# Patient Record
Sex: Male | Born: 1955 | Race: Black or African American | Hispanic: No | Marital: Married | Smoking: Never smoker
Health system: Southern US, Community
[De-identification: ages and names within clinical notes are randomized; demographics above are authoritative.]

---

## 1999-03-25 ENCOUNTER — Emergency Department (HOSPITAL_COMMUNITY): Admission: EM | Admit: 1999-03-25 | Discharge: 1999-03-25 | Payer: Self-pay | Admitting: Emergency Medicine

## 1999-03-25 ENCOUNTER — Encounter: Payer: Self-pay | Admitting: Emergency Medicine

## 2016-06-09 ENCOUNTER — Other Ambulatory Visit: Payer: Self-pay | Admitting: Occupational Medicine

## 2016-06-09 ENCOUNTER — Ambulatory Visit
Admission: RE | Admit: 2016-06-09 | Discharge: 2016-06-09 | Disposition: A | Discharge status: Home / Self Care | Payer: No Typology Code available for payment source | Source: Ambulatory Visit | Attending: Occupational Medicine | Admitting: Occupational Medicine

## 2016-06-09 DIAGNOSIS — Z021 Encounter for pre-employment examination: Secondary | ICD-10-CM

## 2016-06-15 DIAGNOSIS — K640 First degree hemorrhoids: Secondary | ICD-10-CM | POA: Diagnosis not present

## 2016-06-15 DIAGNOSIS — K5730 Diverticulosis of large intestine without perforation or abscess without bleeding: Secondary | ICD-10-CM | POA: Diagnosis not present

## 2016-06-15 DIAGNOSIS — Z86010 Personal history of colonic polyps: Secondary | ICD-10-CM | POA: Diagnosis not present

## 2016-06-27 DIAGNOSIS — R319 Hematuria, unspecified: Secondary | ICD-10-CM | POA: Diagnosis not present

## 2016-07-27 DIAGNOSIS — R3121 Asymptomatic microscopic hematuria: Secondary | ICD-10-CM | POA: Diagnosis not present

## 2016-08-05 DIAGNOSIS — R3121 Asymptomatic microscopic hematuria: Secondary | ICD-10-CM | POA: Diagnosis not present

## 2016-08-05 DIAGNOSIS — R319 Hematuria, unspecified: Secondary | ICD-10-CM | POA: Diagnosis not present

## 2016-08-15 DIAGNOSIS — Z0000 Encounter for general adult medical examination without abnormal findings: Secondary | ICD-10-CM | POA: Diagnosis not present

## 2016-09-05 DIAGNOSIS — R3121 Asymptomatic microscopic hematuria: Secondary | ICD-10-CM | POA: Diagnosis not present

## 2017-02-21 DIAGNOSIS — M25512 Pain in left shoulder: Secondary | ICD-10-CM | POA: Diagnosis not present

## 2017-03-24 DIAGNOSIS — M25512 Pain in left shoulder: Secondary | ICD-10-CM | POA: Diagnosis not present

## 2017-03-24 DIAGNOSIS — M542 Cervicalgia: Secondary | ICD-10-CM | POA: Diagnosis not present

## 2017-03-27 DIAGNOSIS — R3121 Asymptomatic microscopic hematuria: Secondary | ICD-10-CM | POA: Diagnosis not present

## 2017-03-29 DIAGNOSIS — M25512 Pain in left shoulder: Secondary | ICD-10-CM | POA: Diagnosis not present

## 2017-03-29 DIAGNOSIS — M7542 Impingement syndrome of left shoulder: Secondary | ICD-10-CM | POA: Diagnosis not present

## 2017-03-29 DIAGNOSIS — S46012D Strain of muscle(s) and tendon(s) of the rotator cuff of left shoulder, subsequent encounter: Secondary | ICD-10-CM | POA: Diagnosis not present

## 2017-04-10 DIAGNOSIS — M7542 Impingement syndrome of left shoulder: Secondary | ICD-10-CM | POA: Diagnosis not present

## 2017-04-10 DIAGNOSIS — S46012D Strain of muscle(s) and tendon(s) of the rotator cuff of left shoulder, subsequent encounter: Secondary | ICD-10-CM | POA: Diagnosis not present

## 2017-04-10 DIAGNOSIS — M25512 Pain in left shoulder: Secondary | ICD-10-CM | POA: Diagnosis not present

## 2017-04-12 DIAGNOSIS — M7542 Impingement syndrome of left shoulder: Secondary | ICD-10-CM | POA: Diagnosis not present

## 2017-04-12 DIAGNOSIS — M25512 Pain in left shoulder: Secondary | ICD-10-CM | POA: Diagnosis not present

## 2017-04-12 DIAGNOSIS — S46012D Strain of muscle(s) and tendon(s) of the rotator cuff of left shoulder, subsequent encounter: Secondary | ICD-10-CM | POA: Diagnosis not present

## 2017-04-19 DIAGNOSIS — M7542 Impingement syndrome of left shoulder: Secondary | ICD-10-CM | POA: Diagnosis not present

## 2017-04-19 DIAGNOSIS — M25512 Pain in left shoulder: Secondary | ICD-10-CM | POA: Diagnosis not present

## 2017-04-19 DIAGNOSIS — S46102D Unspecified injury of muscle, fascia and tendon of long head of biceps, left arm, subsequent encounter: Secondary | ICD-10-CM | POA: Diagnosis not present

## 2017-04-21 DIAGNOSIS — M25512 Pain in left shoulder: Secondary | ICD-10-CM | POA: Diagnosis not present

## 2017-04-25 DIAGNOSIS — M25512 Pain in left shoulder: Secondary | ICD-10-CM | POA: Diagnosis not present

## 2017-04-25 DIAGNOSIS — S46012D Strain of muscle(s) and tendon(s) of the rotator cuff of left shoulder, subsequent encounter: Secondary | ICD-10-CM | POA: Diagnosis not present

## 2017-04-25 DIAGNOSIS — M7542 Impingement syndrome of left shoulder: Secondary | ICD-10-CM | POA: Diagnosis not present

## 2017-05-03 DIAGNOSIS — M7542 Impingement syndrome of left shoulder: Secondary | ICD-10-CM | POA: Diagnosis not present

## 2017-05-03 DIAGNOSIS — S46012D Strain of muscle(s) and tendon(s) of the rotator cuff of left shoulder, subsequent encounter: Secondary | ICD-10-CM | POA: Diagnosis not present

## 2017-05-03 DIAGNOSIS — M25512 Pain in left shoulder: Secondary | ICD-10-CM | POA: Diagnosis not present

## 2017-05-11 DIAGNOSIS — M25512 Pain in left shoulder: Secondary | ICD-10-CM | POA: Diagnosis not present

## 2017-05-11 DIAGNOSIS — S46012D Strain of muscle(s) and tendon(s) of the rotator cuff of left shoulder, subsequent encounter: Secondary | ICD-10-CM | POA: Diagnosis not present

## 2017-05-11 DIAGNOSIS — M7542 Impingement syndrome of left shoulder: Secondary | ICD-10-CM | POA: Diagnosis not present

## 2017-05-18 DIAGNOSIS — M25512 Pain in left shoulder: Secondary | ICD-10-CM | POA: Diagnosis not present

## 2017-05-18 DIAGNOSIS — M7542 Impingement syndrome of left shoulder: Secondary | ICD-10-CM | POA: Diagnosis not present

## 2017-05-18 DIAGNOSIS — S46012D Strain of muscle(s) and tendon(s) of the rotator cuff of left shoulder, subsequent encounter: Secondary | ICD-10-CM | POA: Diagnosis not present

## 2017-05-23 DIAGNOSIS — S46012D Strain of muscle(s) and tendon(s) of the rotator cuff of left shoulder, subsequent encounter: Secondary | ICD-10-CM | POA: Diagnosis not present

## 2017-05-23 DIAGNOSIS — M25512 Pain in left shoulder: Secondary | ICD-10-CM | POA: Diagnosis not present

## 2017-05-23 DIAGNOSIS — M7542 Impingement syndrome of left shoulder: Secondary | ICD-10-CM | POA: Diagnosis not present

## 2017-05-24 DIAGNOSIS — M25512 Pain in left shoulder: Secondary | ICD-10-CM | POA: Diagnosis not present

## 2017-05-24 DIAGNOSIS — S46012D Strain of muscle(s) and tendon(s) of the rotator cuff of left shoulder, subsequent encounter: Secondary | ICD-10-CM | POA: Diagnosis not present

## 2017-05-24 DIAGNOSIS — M7542 Impingement syndrome of left shoulder: Secondary | ICD-10-CM | POA: Diagnosis not present

## 2017-06-01 DIAGNOSIS — S46012D Strain of muscle(s) and tendon(s) of the rotator cuff of left shoulder, subsequent encounter: Secondary | ICD-10-CM | POA: Diagnosis not present

## 2017-06-01 DIAGNOSIS — M7542 Impingement syndrome of left shoulder: Secondary | ICD-10-CM | POA: Diagnosis not present

## 2017-06-01 DIAGNOSIS — M25512 Pain in left shoulder: Secondary | ICD-10-CM | POA: Diagnosis not present

## 2017-06-07 DIAGNOSIS — S46012D Strain of muscle(s) and tendon(s) of the rotator cuff of left shoulder, subsequent encounter: Secondary | ICD-10-CM | POA: Diagnosis not present

## 2017-06-07 DIAGNOSIS — M25512 Pain in left shoulder: Secondary | ICD-10-CM | POA: Diagnosis not present

## 2017-06-07 DIAGNOSIS — M7542 Impingement syndrome of left shoulder: Secondary | ICD-10-CM | POA: Diagnosis not present

## 2017-06-09 DIAGNOSIS — M25512 Pain in left shoulder: Secondary | ICD-10-CM | POA: Diagnosis not present

## 2017-06-15 DIAGNOSIS — M25512 Pain in left shoulder: Secondary | ICD-10-CM | POA: Diagnosis not present

## 2017-06-19 DIAGNOSIS — M25512 Pain in left shoulder: Secondary | ICD-10-CM | POA: Diagnosis not present

## 2017-06-19 DIAGNOSIS — S46012D Strain of muscle(s) and tendon(s) of the rotator cuff of left shoulder, subsequent encounter: Secondary | ICD-10-CM | POA: Diagnosis not present

## 2017-06-19 DIAGNOSIS — M7542 Impingement syndrome of left shoulder: Secondary | ICD-10-CM | POA: Diagnosis not present

## 2017-06-21 DIAGNOSIS — M7542 Impingement syndrome of left shoulder: Secondary | ICD-10-CM | POA: Diagnosis not present

## 2017-06-21 DIAGNOSIS — S46012D Strain of muscle(s) and tendon(s) of the rotator cuff of left shoulder, subsequent encounter: Secondary | ICD-10-CM | POA: Diagnosis not present

## 2017-06-21 DIAGNOSIS — M25512 Pain in left shoulder: Secondary | ICD-10-CM | POA: Diagnosis not present

## 2017-06-25 ENCOUNTER — Emergency Department (HOSPITAL_BASED_OUTPATIENT_CLINIC_OR_DEPARTMENT_OTHER): Payer: 59

## 2017-06-25 ENCOUNTER — Emergency Department (HOSPITAL_BASED_OUTPATIENT_CLINIC_OR_DEPARTMENT_OTHER)
Admission: EM | Admit: 2017-06-25 | Discharge: 2017-06-26 | Disposition: A | Discharge status: Home / Self Care | Payer: 59 | Attending: Emergency Medicine | Admitting: Emergency Medicine

## 2017-06-25 ENCOUNTER — Encounter (HOSPITAL_BASED_OUTPATIENT_CLINIC_OR_DEPARTMENT_OTHER): Payer: Self-pay | Admitting: Emergency Medicine

## 2017-06-25 ENCOUNTER — Other Ambulatory Visit: Payer: Self-pay

## 2017-06-25 DIAGNOSIS — Z7982 Long term (current) use of aspirin: Secondary | ICD-10-CM | POA: Insufficient documentation

## 2017-06-25 DIAGNOSIS — Y92410 Unspecified street and highway as the place of occurrence of the external cause: Secondary | ICD-10-CM | POA: Diagnosis not present

## 2017-06-25 DIAGNOSIS — S61309A Unspecified open wound of unspecified finger with damage to nail, initial encounter: Secondary | ICD-10-CM

## 2017-06-25 DIAGNOSIS — M25512 Pain in left shoulder: Secondary | ICD-10-CM | POA: Insufficient documentation

## 2017-06-25 DIAGNOSIS — S62514A Nondisplaced fracture of proximal phalanx of right thumb, initial encounter for closed fracture: Secondary | ICD-10-CM | POA: Insufficient documentation

## 2017-06-25 DIAGNOSIS — M542 Cervicalgia: Secondary | ICD-10-CM | POA: Diagnosis not present

## 2017-06-25 DIAGNOSIS — S6991XA Unspecified injury of right wrist, hand and finger(s), initial encounter: Secondary | ICD-10-CM | POA: Diagnosis present

## 2017-06-25 DIAGNOSIS — Y998 Other external cause status: Secondary | ICD-10-CM | POA: Insufficient documentation

## 2017-06-25 DIAGNOSIS — Y9389 Activity, other specified: Secondary | ICD-10-CM | POA: Diagnosis not present

## 2017-06-25 DIAGNOSIS — S61001A Unspecified open wound of right thumb without damage to nail, initial encounter: Secondary | ICD-10-CM | POA: Diagnosis not present

## 2017-06-25 MED ORDER — HYDROCODONE-ACETAMINOPHEN 5-325 MG PO TABS
1.0000 | ORAL_TABLET | Freq: Once | ORAL | Status: AC
  Administered 2017-06-25: 1 via ORAL
  Filled 2017-06-25: qty 1

## 2017-06-25 MED ORDER — HYDROCODONE-ACETAMINOPHEN 5-325 MG PO TABS: 1.0000 | ORAL_TABLET | Freq: Four times a day (QID) | ORAL | 0 refills | Status: AC | PRN
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
## 2017-06-25 NOTE — ED Notes (Signed)
Patient transported to X-ray 
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
## 2017-06-26 NOTE — ED Notes (Signed)
Pt given d/c instructions as per chart. Rx x 1 with precaUTIONS. vERBALIZES UNDERSTANDING. nO QUESTIONS.

## 2017-06-30 DIAGNOSIS — S62515D Nondisplaced fracture of proximal phalanx of left thumb, subsequent encounter for fracture with routine healing: Secondary | ICD-10-CM | POA: Diagnosis not present

## 2017-06-30 DIAGNOSIS — M25512 Pain in left shoulder: Secondary | ICD-10-CM | POA: Diagnosis not present

## 2017-06-30 DIAGNOSIS — M542 Cervicalgia: Secondary | ICD-10-CM | POA: Diagnosis not present

## 2017-07-04 DIAGNOSIS — M25512 Pain in left shoulder: Secondary | ICD-10-CM | POA: Diagnosis not present

## 2017-07-05 DIAGNOSIS — S46012D Strain of muscle(s) and tendon(s) of the rotator cuff of left shoulder, subsequent encounter: Secondary | ICD-10-CM | POA: Diagnosis not present

## 2017-07-05 DIAGNOSIS — M25512 Pain in left shoulder: Secondary | ICD-10-CM | POA: Diagnosis not present

## 2017-07-05 DIAGNOSIS — M7542 Impingement syndrome of left shoulder: Secondary | ICD-10-CM | POA: Diagnosis not present

## 2017-07-10 DIAGNOSIS — M7542 Impingement syndrome of left shoulder: Secondary | ICD-10-CM | POA: Diagnosis not present

## 2017-07-10 DIAGNOSIS — M25512 Pain in left shoulder: Secondary | ICD-10-CM | POA: Diagnosis not present

## 2017-07-10 DIAGNOSIS — S46012D Strain of muscle(s) and tendon(s) of the rotator cuff of left shoulder, subsequent encounter: Secondary | ICD-10-CM | POA: Diagnosis not present

## 2017-07-12 DIAGNOSIS — S46012D Strain of muscle(s) and tendon(s) of the rotator cuff of left shoulder, subsequent encounter: Secondary | ICD-10-CM | POA: Diagnosis not present

## 2017-07-12 DIAGNOSIS — M7542 Impingement syndrome of left shoulder: Secondary | ICD-10-CM | POA: Diagnosis not present

## 2017-07-12 DIAGNOSIS — M25512 Pain in left shoulder: Secondary | ICD-10-CM | POA: Diagnosis not present

## 2017-07-17 DIAGNOSIS — M7542 Impingement syndrome of left shoulder: Secondary | ICD-10-CM | POA: Diagnosis not present

## 2017-07-17 DIAGNOSIS — M25512 Pain in left shoulder: Secondary | ICD-10-CM | POA: Diagnosis not present

## 2017-07-17 DIAGNOSIS — S46012D Strain of muscle(s) and tendon(s) of the rotator cuff of left shoulder, subsequent encounter: Secondary | ICD-10-CM | POA: Diagnosis not present

## 2017-07-18 DIAGNOSIS — M25512 Pain in left shoulder: Secondary | ICD-10-CM | POA: Diagnosis not present

## 2017-07-24 DIAGNOSIS — M7542 Impingement syndrome of left shoulder: Secondary | ICD-10-CM | POA: Diagnosis not present

## 2017-07-24 DIAGNOSIS — M25512 Pain in left shoulder: Secondary | ICD-10-CM | POA: Diagnosis not present

## 2017-07-24 DIAGNOSIS — S46012D Strain of muscle(s) and tendon(s) of the rotator cuff of left shoulder, subsequent encounter: Secondary | ICD-10-CM | POA: Diagnosis not present

## 2017-07-26 DIAGNOSIS — S46012D Strain of muscle(s) and tendon(s) of the rotator cuff of left shoulder, subsequent encounter: Secondary | ICD-10-CM | POA: Diagnosis not present

## 2017-07-26 DIAGNOSIS — M25512 Pain in left shoulder: Secondary | ICD-10-CM | POA: Diagnosis not present

## 2017-07-26 DIAGNOSIS — M7542 Impingement syndrome of left shoulder: Secondary | ICD-10-CM | POA: Diagnosis not present

## 2017-07-31 DIAGNOSIS — M7542 Impingement syndrome of left shoulder: Secondary | ICD-10-CM | POA: Diagnosis not present

## 2017-07-31 DIAGNOSIS — M25512 Pain in left shoulder: Secondary | ICD-10-CM | POA: Diagnosis not present

## 2017-07-31 DIAGNOSIS — S46012D Strain of muscle(s) and tendon(s) of the rotator cuff of left shoulder, subsequent encounter: Secondary | ICD-10-CM | POA: Diagnosis not present

## 2017-08-02 DIAGNOSIS — M25512 Pain in left shoulder: Secondary | ICD-10-CM | POA: Diagnosis not present

## 2017-08-02 DIAGNOSIS — S46012D Strain of muscle(s) and tendon(s) of the rotator cuff of left shoulder, subsequent encounter: Secondary | ICD-10-CM | POA: Diagnosis not present

## 2017-08-02 DIAGNOSIS — M7542 Impingement syndrome of left shoulder: Secondary | ICD-10-CM | POA: Diagnosis not present

## 2017-08-07 DIAGNOSIS — S46012D Strain of muscle(s) and tendon(s) of the rotator cuff of left shoulder, subsequent encounter: Secondary | ICD-10-CM | POA: Diagnosis not present

## 2017-08-07 DIAGNOSIS — M25512 Pain in left shoulder: Secondary | ICD-10-CM | POA: Diagnosis not present

## 2017-08-07 DIAGNOSIS — M7542 Impingement syndrome of left shoulder: Secondary | ICD-10-CM | POA: Diagnosis not present

## 2017-08-09 DIAGNOSIS — M7542 Impingement syndrome of left shoulder: Secondary | ICD-10-CM | POA: Diagnosis not present

## 2017-08-09 DIAGNOSIS — S46012D Strain of muscle(s) and tendon(s) of the rotator cuff of left shoulder, subsequent encounter: Secondary | ICD-10-CM | POA: Diagnosis not present

## 2017-08-09 DIAGNOSIS — M25512 Pain in left shoulder: Secondary | ICD-10-CM | POA: Diagnosis not present

## 2017-08-14 DIAGNOSIS — M25512 Pain in left shoulder: Secondary | ICD-10-CM | POA: Diagnosis not present

## 2017-08-14 DIAGNOSIS — S46012D Strain of muscle(s) and tendon(s) of the rotator cuff of left shoulder, subsequent encounter: Secondary | ICD-10-CM | POA: Diagnosis not present

## 2017-08-14 DIAGNOSIS — M7542 Impingement syndrome of left shoulder: Secondary | ICD-10-CM | POA: Diagnosis not present

## 2017-08-15 DIAGNOSIS — M79644 Pain in right finger(s): Secondary | ICD-10-CM | POA: Diagnosis not present

## 2017-08-23 DIAGNOSIS — E7800 Pure hypercholesterolemia, unspecified: Secondary | ICD-10-CM | POA: Diagnosis not present

## 2017-08-23 DIAGNOSIS — Z0000 Encounter for general adult medical examination without abnormal findings: Secondary | ICD-10-CM | POA: Diagnosis not present

## 2017-11-07 DIAGNOSIS — M542 Cervicalgia: Secondary | ICD-10-CM | POA: Diagnosis not present

## 2018-03-29 DIAGNOSIS — R350 Frequency of micturition: Secondary | ICD-10-CM | POA: Diagnosis not present

## 2018-04-24 DIAGNOSIS — N486 Induration penis plastica: Secondary | ICD-10-CM | POA: Diagnosis not present

## 2018-05-08 DIAGNOSIS — N486 Induration penis plastica: Secondary | ICD-10-CM | POA: Diagnosis not present

## 2018-05-08 DIAGNOSIS — R109 Unspecified abdominal pain: Secondary | ICD-10-CM | POA: Diagnosis not present

## 2018-05-08 DIAGNOSIS — R040 Epistaxis: Secondary | ICD-10-CM | POA: Diagnosis not present

## 2018-08-17 DIAGNOSIS — Z87448 Personal history of other diseases of urinary system: Secondary | ICD-10-CM | POA: Diagnosis not present

## 2018-08-17 DIAGNOSIS — N486 Induration penis plastica: Secondary | ICD-10-CM | POA: Diagnosis not present
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
## 2018-09-20 ENCOUNTER — Encounter (HOSPITAL_BASED_OUTPATIENT_CLINIC_OR_DEPARTMENT_OTHER): Payer: Self-pay | Admitting: *Deleted

## 2018-09-20 ENCOUNTER — Emergency Department (HOSPITAL_BASED_OUTPATIENT_CLINIC_OR_DEPARTMENT_OTHER)
Admission: EM | Admit: 2018-09-20 | Discharge: 2018-09-20 | Disposition: A | Discharge status: Home / Self Care | Payer: 59 | Attending: Emergency Medicine | Admitting: Emergency Medicine

## 2018-09-20 ENCOUNTER — Other Ambulatory Visit: Payer: Self-pay

## 2018-09-20 DIAGNOSIS — I10 Essential (primary) hypertension: Secondary | ICD-10-CM

## 2018-09-20 LAB — URINALYSIS, MICROSCOPIC (REFLEX)

## 2018-09-20 LAB — BASIC METABOLIC PANEL
Anion gap: 8 (ref 5–15)
BUN: 15 mg/dL (ref 6–20)
CO2: 25 mmol/L (ref 22–32)
Calcium: 9.2 mg/dL (ref 8.9–10.3)
Chloride: 103 mmol/L (ref 98–111)
Creatinine, Ser: 1.42 mg/dL — ABNORMAL HIGH (ref 0.61–1.24)
GFR calc Af Amer: >60 mL/min (ref >60)
GFR calc non Af Amer: 53 mL/min — ABNORMAL LOW (ref >60)
Glucose, Bld: 125 mg/dL — ABNORMAL HIGH (ref 70–99)
Potassium: 3.7 mmol/L (ref 3.5–5.1)
Sodium: 136 mmol/L (ref 135–145)

## 2018-09-20 LAB — URINALYSIS, ROUTINE W REFLEX MICROSCOPIC
Bilirubin Urine: NEGATIVE
Glucose, UA: NEGATIVE mg/dL
Ketones, ur: NEGATIVE mg/dL
Leukocytes,Ua: NEGATIVE
Nitrite: NEGATIVE
Protein, ur: NEGATIVE mg/dL
Specific Gravity, Urine: 1.02 (ref 1.005–1.030)
pH: 6 (ref 5.0–8.0)

## 2018-09-20 NOTE — ED Notes (Signed)
ED Provider at bedside. 

## 2018-09-20 NOTE — ED Provider Notes (Signed)
Emergency Department Provider Note   I have reviewed the triage vital signs and the nursing notes.   HISTORY  Chief Complaint Hypertension   HPI Francis Holloway is a 63 y.o. male with no significant PMH resents to the emergency department with elevated blood pressure readings at home.  Patient states that his wife has high blood pressure and checks her pressures at home regularly.  He will typically take his blood pressure along with hers multiple times per week and tells me that his readings are typically 120s over 80s.  Yesterday he took his blood pressure and recorded a systolic of 140s.  He may no immediate changes but checked it again today and had a systolic blood pressure in the 160s.  Patient states he proceeded to take it several times afterwards and got higher numbers each time.  He denies any symptoms.  He specifically denies chest pain, heart palpitations, numbness/weakness, vision changes, headaches.  He has never been on antihypertensives.  He does follow regularly with his PCP.  He tells me that both he and his wife try and adhere to a low-sodium diet.   History reviewed. No pertinent past medical history.  There are no active problems to display for this patient.   History reviewed. No pertinent surgical history.  Allergies Patient has no known allergies.  No family history on file.  Social History Social History   Tobacco Use  . Smoking status: Never Smoker  . Smokeless tobacco: Never Used  Substance Use Topics  . Alcohol use: No    Frequency: Never  . Drug use: No    Review of Systems  Constitutional: No fever/chills Eyes: No visual changes. ENT: No sore throat. Cardiovascular: Denies chest pain. Positive HTN.  Respiratory: Denies shortness of breath. Gastrointestinal: No abdominal pain.  No nausea, no vomiting.  No diarrhea.  No constipation. Genitourinary: Negative for dysuria. Musculoskeletal: Negative for back pain. Skin: Negative for rash.  Neurological: Negative for headaches, focal weakness or numbness.  10-point ROS otherwise negative.  ____________________________________________   PHYSICAL EXAM:  VITAL SIGNS: ED Triage Vitals  Enc Vitals Group     BP 09/20/18 1307 (!) 175/99     Pulse Rate 09/20/18 1304 83     Resp 09/20/18 1304 20     Temp 09/20/18 1304 98.2 F (36.8 C)     Temp Source 09/20/18 1304 Oral     SpO2 09/20/18 1304 100 %     Weight 09/20/18 1306 190 lb (86.2 kg)     Height 09/20/18 1306 6\' 1"  (1.854 m)    Constitutional: Alert and oriented. Well appearing and in no acute distress. Eyes: Conjunctivae are normal. Head: Atraumatic. Nose: No congestion/rhinnorhea. Mouth/Throat: Mucous membranes are moist.  Oropharynx non-erythematous. Neck: No stridor.  Cardiovascular: Normal rate, regular rhythm. Good peripheral circulation. Grossly normal heart sounds.   Respiratory: Normal respiratory effort.  No retractions. Lungs CTAB. Gastrointestinal: Soft and nontender. No distention.  Musculoskeletal: No lower extremity tenderness nor edema. No gross deformities of extremities. Neurologic:  Normal speech and language. No gross focal neurologic deficits are appreciated.  Skin:  Skin is warm, dry and intact. No rash noted.   ____________________________________________   LABS (all labs ordered are listed, but only abnormal results are displayed)  Labs Reviewed  BASIC METABOLIC PANEL - Abnormal; Notable for the following components:      Result Value   Glucose, Bld 125 (*)    Creatinine, Ser 1.42 (*)    GFR calc non Af  Amer 66 (*)    All other components within normal limits  URINALYSIS, ROUTINE W REFLEX MICROSCOPIC - Abnormal; Notable for the following components:   Hgb urine dipstick MODERATE (*)    All other components within normal limits  URINALYSIS, MICROSCOPIC (REFLEX) - Abnormal; Notable for the following components:   Bacteria, UA RARE (*)    All other components within normal limits    ____________________________________________   PROCEDURES  Procedure(s) performed:   Procedures  None  ____________________________________________   INITIAL IMPRESSION / ASSESSMENT AND PLAN / ED COURSE  Pertinent labs & imaging results that were available during my care of the patient were reviewed by me and considered in my medical decision making (see chart for details).   Presents to the emergency department with asymptomatic hypertension.  He apparently checks his blood pressure regularly with his wife and yesterday began getting more elevated readings.  He has no findings on exam or historical features to suspect hypertensive emergency.  Plan for screening basic metabolic panel and urine analysis.  No chest pain.  No indication for imaging.   02:05 PM  Lab work reviewed and discussed with the patient.  His creatinine is 1.42 with no baseline value for comparison.  Blood pressure is downtrending here without intervention.  Discussed the pros and cons of starting blood pressure medication in the setting versus diet/exercise at home and PCP follow-up.  Patient plans to follow with his PCP.  We discussed the DASH diet and provided written information at discharge.  Discussed signs and symptoms which would prompt a return to the ED. Patient pleased at discharge.  ____________________________________________  FINAL CLINICAL IMPRESSION(S) / ED DIAGNOSES  Final diagnoses:  Essential hypertension    Note:  This document was prepared using Dragon voice recognition software and may include unintentional dictation errors.  Alona Bene, MD Emergency Medicine    Apolonia Ellwood, Arlyss Repress, MD 09/20/18 [phone]

## 2018-09-20 NOTE — Discharge Instructions (Signed)

## 2018-09-20 NOTE — ED Triage Notes (Signed)
No hx of HTN. States he has been taking his BP at home and it has been elevated for the past few days. No symptoms.

## 2020-04-02 ENCOUNTER — Other Ambulatory Visit: Payer: 59

## 2022-08-18 ENCOUNTER — Ambulatory Visit: Payer: 59 | Admitting: Dermatology

## 2022-08-30 ENCOUNTER — Ambulatory Visit: Payer: 59 | Admitting: Dermatology

## 2022-08-30 ENCOUNTER — Encounter: Payer: Self-pay | Admitting: Dermatology

## 2022-08-30 DIAGNOSIS — L821 Other seborrheic keratosis: Secondary | ICD-10-CM

## 2022-08-30 DIAGNOSIS — D489 Neoplasm of uncertain behavior, unspecified: Secondary | ICD-10-CM

## 2022-08-30 DIAGNOSIS — D2262 Melanocytic nevi of left upper limb, including shoulder: Secondary | ICD-10-CM | POA: Diagnosis not present

## 2022-08-30 DIAGNOSIS — L304 Erythema intertrigo: Secondary | ICD-10-CM

## 2022-08-30 MED ORDER — NYSTATIN-TRIAMCINOLONE 100000-0.1 UNIT/GM-% EX OINT: 1.0000 | TOPICAL_OINTMENT | Freq: Two times a day (BID) | CUTANEOUS | 0 refills | Status: AC

## 2022-08-30 NOTE — Progress Notes (Signed)
   New Patient Visit  Subjective  Francis Holloway is a 67 y.o. male who presents for the following: Annual Exam (Patient is here for an annual skin check. Last dermatology about a year or two ago. C/o nevus on back and check. No hx of skin or family hx of skin cancer. ).    Objective  Well appearing patient in no apparent distress; mood and affect are within normal limits.  A full examination was performed including scalp, head, eyes, ears, nose, lips, neck, chest, axillae, abdomen, back, buttocks, bilateral upper extremities, bilateral lower extremities, hands, feet, fingers, toes, fingernails, and toenails. All findings within normal limits unless otherwise noted below.  A dermatoscope was used during the exam.  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 Expectations: Dermatosis Papulosa Valaria Good are benign growths. No treatment is necessary. Plan: Cosmetic Quote The patient received the following cosmetic quote   Other Procedure(Skin Tags): Quote for cosmetic removal:  $200 to remove on 1 area $300 to remove on 2 areas $400 to removal on 3 areas    LENTIGINES, SEBORRHEIC KERATOSES, HEMANGIOMAS - Benign normal skin lesions - Benign-appearing - Call for any changes  MELANOCYTIC NEVI - Tan-brown and/or pink-flesh-colored symmetric macules and papules - Benign appearing on exam today - Observation - Call clinic for new or changing moles - Recommend daily use of broad spectrum spf 30+ sunscreen to sun-exposed areas.   ACTINIC DAMAGE - Chronic condition, secondary to cumulative UV/sun exposure - diffuse scaly erythematous  macules with underlying dyspigmentation - Recommend daily broad spectrum sunscreen SPF 30+ to sun-exposed areas, reapply every 2 hours as needed.  - Staying in the shade or wearing long sleeves, sun glasses (UVA+UVB protection) and wide brim hats (4-inch brim around the entire circumference of the hat) are also recommended for sun protection.  - Call for new or changing lesions.  SKIN CANCER SCREENING PERFORMED TODAY  No follow-ups on file.  I, Zigmund Gottron, CMA, am acting as scribe for Ellard Artis, MD.  Documentation: I have reviewed the above documentation for accuracy and completeness, and I agree with the above  [place], DO

## 2022-08-30 NOTE — Patient Instructions (Addendum)
Dermatosis Papulosa Nigra  Skin Care: Dermatosis Papulosa Valaria Good can resolve with light electrodesiccation. Expectations: Dermatosis Papulosa Valaria Good are benign growths. No treatment is necessary. Plan: Cosmetic Quote The patient received the following cosmetic quote   Other Procedure(Skin Tags): Quote for cosmetic removal:  $200 to remove on 1 area $300 to remove on 2 areas $400 to removal on 3 areas   Patient Handout: Wound Care for Skin Biopsy Site  Patient Handout: Wound Care for Skin Biopsy Site  Taking Care of Your Skin Biopsy Site  Proper care of the biopsy site is essential for promoting healing and minimizing scarring. This handout provides instructions on how to care for your biopsy site to ensure optimal recovery.  1. Cleaning the Wound:  Clean the biopsy site daily with gentle soap and water. Gently pat the area dry with a clean, soft towel. Avoid harsh scrubbing or rubbing the area, as this can irritate the skin and delay healing.  2. Applying Aquaphor and Bandage:  After cleaning the wound, apply a thin layer of Aquaphor ointment to the biopsy site. Cover the area with a sterile bandage to protect it from dirt, bacteria, and friction. Change the bandage daily or as needed if it becomes soiled or wet.  3. Continued Care for One Week:  Repeat the cleaning, Aquaphor application, and bandaging process daily for one week following the biopsy procedure. Keeping the wound clean and moist during this initial healing period will help prevent infection and promote optimal healing.  4. Massaging Aquaphor into the Area:  ---After one week, discontinue the use of bandages but continue to apply Aquaphor to the biopsy site. ----Gently massage the Aquaphor into the area using circular motions. ---Massaging the skin helps to promote circulation and prevent the formation of scar tissue.   Additional Tips:  Avoid exposing the biopsy site to direct sunlight during the healing  process, as this can cause hyperpigmentation or worsen scarring. If you experience any signs of infection, such as increased redness, swelling, warmth, or drainage from the wound, contact your healthcare provider immediately. Follow any additional instructions provided by your healthcare provider for caring for the biopsy site and managing any discomfort. Conclusion:  Taking proper care of your skin biopsy site is crucial for ensuring optimal healing and minimizing scarring. By following these instructions for cleaning, applying Aquaphor, and massaging the area, you can promote a smooth and successful recovery. If you have any questions or concerns about caring for your biopsy site, don't hesitate to contact your healthcare provider for guidance.    Due to recent changes in healthcare laws, you may see results of your pathology and/or laboratory studies on MyChart before the doctors have had a chance to review them. We understand that in some cases there may be results that are confusing or concerning to you. Please understand that not all results are received at the same time and often the doctors may need to interpret multiple results in order to provide you with the best plan of care or course of treatment. Therefore, we ask that you please give [place] 2 business days to thoroughly review all your results before contacting the office for clarification. Should we see a critical lab result, you will be contacted sooner.   If You Need Anything After Your Visit  If you have any questions or concerns for your doctor, please call our main line at [phone] If no one answers, please leave a voicemail as directed and we will return your call as soon  as possible. Messages left after 4 pm will be answered the following business day.   You may also send [place] a message via [place]. We typically respond to MyChart messages within 1-2 business days.  For prescription refills, please ask your pharmacy to contact  our office. Our fax number is [phone].  If you have an urgent issue when the clinic is closed that cannot wait until the next business day, you can page your doctor at the number below.    Please note that while we do our best to be available for urgent issues outside of office hours, we are not available 24/7.   If you have an urgent issue and are unable to reach [place], you may choose to seek medical care at your doctor's office, retail clinic, urgent care center, or emergency room.  If you have a medical emergency, please immediately call 911 or go to the emergency department. In the event of inclement weather, please call our main line at [phone] for an update on the status of any delays or closures.  Dermatology Medication Tips: Please keep the boxes that topical medications come in in order to help keep track of the instructions about where and how to use these. Pharmacies typically print the medication instructions only on the boxes and not directly on the medication tubes.   If your medication is too expensive, please contact our office at [phone] or send [place] a message through [place].   We are unable to tell what your co-pay for medications will be in advance as this is different depending on your insurance coverage. However, we may be able to find a substitute medication at lower cost or fill out paperwork to get insurance to cover a needed medication.   If a prior authorization is required to get your medication covered by your insurance company, please allow [place] 1-2 business days to complete this process.  Drug prices often vary depending on where the prescription is filled and some pharmacies may offer cheaper prices.  The website www.goodrx.com contains coupons for medications through different pharmacies. The prices here do not account for what the cost may be with help from insurance (it may be cheaper with your insurance), but the website can give you the price if you  did not use any insurance.  - You can print the associated coupon and take it with your prescription to the pharmacy.  - You may also stop by our office during regular business hours and pick up a GoodRx coupon card.  - If you need your prescription sent electronically to a different pharmacy, notify our office through [facility] or by phone at [phone]

## 2022-09-05 ENCOUNTER — Telehealth: Payer: Self-pay

## 2022-09-05 NOTE — Telephone Encounter (Signed)
Advised patient of pathology results/hd 

## 2022-09-05 NOTE — Telephone Encounter (Signed)
-----   Message from Talbot Grumbling, MD sent at 09/05/2022  8:23 AM EDT ----- Please let patient know the lesion was a benign "mole" and no additional treatment required  Diagnosis Skin , left palmar middle 2nd finger MELANOCYTIC NEVUS, JUNCTIONAL ACRAL TYPE, LIMITED MARGINS FREE

## 2022-09-16 NOTE — Progress Notes (Signed)
Hi Francis Holloway  Dr. Onalee Hua reviewed your biopsy results and they showed the spot removed was benign (not cancerous).  No additional treatment is required.  The detailed report is available to view in MyChart.  Have a great day!  Kind Regards,  Dr. Kermit Balo Care Team

## 2024-07-23 ENCOUNTER — Ambulatory Visit: Admitting: Dermatology
